# Patient Record
Sex: Male | Born: 1985 | Race: White | Hispanic: No | Marital: Single | Smoking: Current every day smoker
Health system: Southern US, Community
[De-identification: ages and names within clinical notes are randomized; demographics above are authoritative.]

## PROBLEM LIST (undated history)

## (undated) HISTORY — PX: ANKLE SURGERY: SHX546

---
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
## 2005-02-02 ENCOUNTER — Emergency Department (HOSPITAL_COMMUNITY): Admission: EM | Admit: 2005-02-02 | Discharge: 2005-02-02 | Payer: Self-pay | Admitting: Family Medicine

## 2007-09-28 ENCOUNTER — Emergency Department (HOSPITAL_COMMUNITY): Admission: EM | Admit: 2007-09-28 | Discharge: 2007-09-28 | Payer: Self-pay | Admitting: Emergency Medicine

## 2007-09-29 ENCOUNTER — Ambulatory Visit (HOSPITAL_COMMUNITY): Admission: RE | Admit: 2007-09-29 | Discharge: 2007-09-29 | Payer: Self-pay | Admitting: Emergency Medicine

## 2008-06-03 ENCOUNTER — Emergency Department (HOSPITAL_COMMUNITY): Admission: EM | Admit: 2008-06-03 | Discharge: 2008-06-03 | Payer: Self-pay | Admitting: Emergency Medicine

## 2010-09-28 ENCOUNTER — Emergency Department (HOSPITAL_COMMUNITY): Admission: EM | Admit: 2010-09-28 | Discharge: 2010-09-28 | Payer: Self-pay | Admitting: Emergency Medicine

## 2011-03-28 ENCOUNTER — Inpatient Hospital Stay (INDEPENDENT_AMBULATORY_CARE_PROVIDER_SITE_OTHER)
Admission: RE | Admit: 2011-03-28 | Discharge: 2011-03-28 | Disposition: A | Discharge status: Home / Self Care | Payer: BC Managed Care – PPO | Source: Ambulatory Visit | Attending: Emergency Medicine | Admitting: Emergency Medicine

## 2011-03-28 DIAGNOSIS — S61209A Unspecified open wound of unspecified finger without damage to nail, initial encounter: Secondary | ICD-10-CM

## 2011-05-18 NOTE — H&P (Signed)
[place]. [facility]  Patient:    Rodney Finley, LINCE Visit Number: [id number] MRN: [id number]          Service Type: Attending:  Sherri Rad, M.D. Dictated by:   Sherri Rad, M.D. Proc. Date: 09/15/01 Adm. Date:  09/15/01                           History and Physical  CHIEF COMPLAINT:  Right ankle pain.  HISTORY:  Tiran is a 25 year old young gentleman who was playing pickup street football when he twisted his right ankle.  He had a deformity.  His brother grabbed his foot and put it back into place where it was nice and straight.  He was then taken to [facility] Emergency Room where x-rays were obtained, and I was consulted for further evaluation.  There was no break in the skin at any time around the ankle.  He has no medical problems.  He has never had ankle problem in the past.  PHYSICAL EXAMINATION:  EXTREMITIES:  He has a swollen right ankle.  There is no deformity at this time.  He is neurovascularly intact.  Compartments are soft in leg and foot. Passive range of motion of the toes does not illicit any pain in the leg of foot.  LABORATORY DATA:  Three views of the right ankle were obtained an show a Tillaux fracture the anterolateral aspect of the ankle with what appears to be a supination external rotation type lateral malleolus fracture.  IMPRESSION:  Right Tillaux fracture with lateral malleolus fracture, displaced.  PLAN:  We will plan a CT scan to better delineate the pathology in the region of the plafond.  We will admit him for elevation, pain management, and observation.  We will most likely do him tomorrow.  He is to keep this elevated, and he is nonweightbearing to the lower extremity. Dictated by:   Sherri Rad, M.D. Attending:  Sherri Rad, M.D. DD:  09/15/01 TD:  09/16/01 Job: [id number]
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
## 2011-05-18 NOTE — Op Note (Signed)
[place]. [facility]  Patient:    REMY, VOILES Visit Number: [id number] MRN: [id number]          Service Type: MED Location: PEDS 6123 01 Attending Physician:  Sherri Rad Dictated by:   Sherri Rad, M.D. Proc. Date: 09/16/01 Admit Date:  09/15/2001                             Operative Report  PREOPERATIVE DIAGNOSES: 1. Right displaced Tillaux fracture. 2. Right non-displaced lateral malleus fracture.  POSTOPERATIVE DIAGNOSES: 1. Right displaced Tillaux fracture. 2. Right non-displaced lateral malleus fracture.  OPERATION: 1. Open reduction and internal fixation right Tillaux fracture. 2. Closed management without manipulation right lateral malleolus fracture.  SURGEON:  Sherri Rad, M.D.  ASSISTANTJill Side P. Mahar, P.A.  ANESTHESIA:  General endotracheal tube.  ESTIMATED BLOOD LOSS:  Minimal  TOURNIQUET TIME:  51 minutes  COMPLICATIONS:  None.  DISPOSITION:  Stable to recovery room.  INDICATION:  This is a 25 year old young gentleman who was playing football yesterday when he twisted his right ankle.  He had had deformity that his brother reduced in the field.  He was then taken to [facility] ER where x-rays were obtained and I was consulted for evaluation and treatment. X-rays and CT scan revealed that he had a displaced Tillaux fracture and non-displaced lateral malleolus fracture.  We were going to treat the Tillaux fracture operatively with open reduction and internal fixation and treat the lateral malleolus fracture with closed management.  The parents as well as Claus were explained the risks and benefits of the above. The risks which include infection, neural vessel injury, nonunion, malunion, arthritis, hardware irritation, possible growth plate disturbance and all questions were answered. Benefits were explained too, which include reduction of the fragment to try to decrease the rate of arthritis of the  ankle and anatomic reduction of the fracture fragment.  DESCRIPTION OF PROCEDURE:  The patient was brought to the operating room and placed in the supine position.  After adequate general endotracheal tube anesthesia was administered, as well as Ancef 1 gm piggyback. The right lower extremity was then prepped and draped in a sterile manner with a proximal placed thigh tourniquet.  Bump was placed on the epsilateral hip and internally rotated to the left of the right lower extremity.  The right lower extremity was then gravity exsanguinated and assisted with a 6 Ace wrap. The tourniquet was elevated 290 mmHg.  A longitudinal incision centered just lateral to the fracture site was then made.  Dissection was carried down to the subcutaneous.  The superficial peroneal nerve was isolated and protected throughout the case.  Retinaculum was opened in line with the incision. Extensor digitorum longus muscle belly was then retracted medially and neurovascular structures were protected medially as well.  The capsule was then opened in line with the incision.  Hematoma was evacuated.  The fracture site was identified and using a two point reduction clamp, the fracture was then visually reduced with a small stab incision posterior medially to place the prong. Once this was reduced, this was visualized under C arm guidance in the AP and lateral planes to be in adequate reduction.  Then a cannulated 4.0 partially threaded cancellus screw was then placed under guide wire guidance. This measured 40 mm long. Once this was placed and visualized under C arm guidance in the AP and lateral planes with the reduction  removed.  Final images were saved.  This was also verified visually to be in an excellent alignment and reduction.  Wounds in ankles were copiously irrigated with normal saline.  The capsule was closed with 2-0 Vicryl protecting nerve and vessels from entrapment.  Retinaculum was closed with 2-0  Vicryl again protecting nerves especially superficial peroneal.  The subcutaneous was closed.  The skin was closed with Nylon. A sterile dressing was applied. Jones dressing was applied. The patient was stable to recovery room. Dictated by:   Sherri Rad, M.D. Attending Physician:  Sherri Rad DD:  09/16/01 TD:  09/16/01 Job: [id number]

## 2012-08-20 ENCOUNTER — Encounter (HOSPITAL_COMMUNITY): Payer: Self-pay | Admitting: Emergency Medicine

## 2012-08-20 ENCOUNTER — Emergency Department (HOSPITAL_COMMUNITY)
Admission: EM | Admit: 2012-08-20 | Discharge: 2012-08-21 | Disposition: A | Discharge status: Home / Self Care | Payer: No Typology Code available for payment source | Attending: Emergency Medicine | Admitting: Emergency Medicine

## 2012-08-20 DIAGNOSIS — F17200 Nicotine dependence, unspecified, uncomplicated: Secondary | ICD-10-CM | POA: Insufficient documentation

## 2012-08-20 DIAGNOSIS — Y92410 Unspecified street and highway as the place of occurrence of the external cause: Secondary | ICD-10-CM | POA: Insufficient documentation

## 2012-08-20 DIAGNOSIS — S20219A Contusion of unspecified front wall of thorax, initial encounter: Secondary | ICD-10-CM
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
## 2012-08-21 ENCOUNTER — Emergency Department (HOSPITAL_COMMUNITY): Payer: No Typology Code available for payment source

## 2012-08-21 MED ORDER — IBUPROFEN 200 MG PO TABS
600.0000 mg | ORAL_TABLET | Freq: Once | ORAL | Status: AC
  Administered 2012-08-21: 600 mg via ORAL
  Filled 2012-08-21: qty 1
  Filled 2012-08-21: qty 3

## 2012-08-21 MED ORDER — IBUPROFEN 600 MG PO TABS: 600.0000 mg | ORAL_TABLET | Freq: Four times a day (QID) | ORAL | Status: AC | PRN

## 2012-08-21 MED ORDER — METHOCARBAMOL 500 MG PO TABS: 1000.0000 mg | ORAL_TABLET | Freq: Four times a day (QID) | ORAL | Status: AC

## 2012-08-21 MED ORDER — HYDROCODONE-ACETAMINOPHEN 5-325 MG PO TABS: ORAL_TABLET | ORAL | Status: AC
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
## 2012-08-22 NOTE — ED Provider Notes (Signed)
Medical screening examination/treatment/procedure(s) were performed by non-physician practitioner and as supervising physician I was immediately available for consultation/collaboration.  Hersh Minney, MD 08/22/12 0841 
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
## 2018-03-13 ENCOUNTER — Encounter (HOSPITAL_COMMUNITY): Payer: Self-pay | Admitting: Emergency Medicine

## 2018-03-13 ENCOUNTER — Emergency Department (HOSPITAL_COMMUNITY)
Admission: EM | Admit: 2018-03-13 | Discharge: 2018-03-13 | Disposition: A | Discharge status: Home / Self Care | Payer: Self-pay | Attending: Emergency Medicine | Admitting: Emergency Medicine

## 2018-03-13 ENCOUNTER — Other Ambulatory Visit: Payer: Self-pay

## 2018-03-13 ENCOUNTER — Emergency Department (HOSPITAL_COMMUNITY): Payer: Self-pay

## 2018-03-13 DIAGNOSIS — Y929 Unspecified place or not applicable: Secondary | ICD-10-CM | POA: Insufficient documentation

## 2018-03-13 DIAGNOSIS — Y939 Activity, unspecified: Secondary | ICD-10-CM | POA: Insufficient documentation

## 2018-03-13 DIAGNOSIS — W540XXA Bitten by dog, initial encounter: Secondary | ICD-10-CM | POA: Insufficient documentation

## 2018-03-13 DIAGNOSIS — S61431A Puncture wound without foreign body of right hand, initial encounter: Secondary | ICD-10-CM | POA: Insufficient documentation

## 2018-03-13 DIAGNOSIS — F17210 Nicotine dependence, cigarettes, uncomplicated: Secondary | ICD-10-CM | POA: Insufficient documentation

## 2018-03-13 DIAGNOSIS — Y999 Unspecified external cause status: Secondary | ICD-10-CM | POA: Insufficient documentation

## 2018-03-13 LAB — CBC WITH DIFFERENTIAL/PLATELET
Basophils Absolute: 0 10*3/uL (ref 0.0–0.1)
Basophils Relative: 0 %
EOS ABS: 0.1 10*3/uL (ref 0.0–0.7)
EOS PCT: 1 %
HCT: 44.5 % (ref 39.0–52.0)
Hemoglobin: 14.9 g/dL (ref 13.0–17.0)
LYMPHS ABS: 3.3 10*3/uL (ref 0.7–4.0)
LYMPHS PCT: 27 %
MCH: 30.2 pg (ref 26.0–34.0)
MCHC: 33.5 g/dL (ref 30.0–36.0)
MCV: 90.1 fL (ref 78.0–100.0)
MONO ABS: 1.1 10*3/uL — AB (ref 0.1–1.0)
Monocytes Relative: 9 %
Neutro Abs: 8 10*3/uL — ABNORMAL HIGH (ref 1.7–7.7)
Neutrophils Relative %: 63 %
PLATELETS: 204 10*3/uL (ref 150–400)
RBC: 4.94 MIL/uL (ref 4.22–5.81)
RDW: 12.7 % (ref 11.5–15.5)
WBC: 12.6 10*3/uL — ABNORMAL HIGH (ref 4.0–10.5)

## 2018-03-13 LAB — BASIC METABOLIC PANEL
Anion gap: 11 (ref 5–15)
BUN: 11 mg/dL (ref 6–20)
CALCIUM: 9.3 mg/dL (ref 8.9–10.3)
CO2: 22 mmol/L (ref 22–32)
CREATININE: 0.87 mg/dL (ref 0.61–1.24)
Chloride: 104 mmol/L (ref 101–111)
GFR calc Af Amer: >60 mL/min (ref >60)
GLUCOSE: 87 mg/dL (ref 65–99)
Potassium: 3.8 mmol/L (ref 3.5–5.1)
Sodium: 137 mmol/L (ref 135–145)

## 2018-03-13 MED ORDER — POVIDONE-IODINE 10 % EX SOLN
CUTANEOUS | Status: AC
  Administered 2018-03-13: 1
  Filled 2018-03-13: qty 15

## 2018-03-13 MED ORDER — AMOXICILLIN-POT CLAVULANATE 875-125 MG PO TABS: 1.0000 | ORAL_TABLET | Freq: Two times a day (BID) | ORAL | 0 refills | Status: AC

## 2018-03-13 MED ORDER — IBUPROFEN 800 MG PO TABS: 800.0000 mg | ORAL_TABLET | Freq: Three times a day (TID) | ORAL | 0 refills | Status: AC

## 2018-03-13 MED ORDER — KETOROLAC TROMETHAMINE 30 MG/ML IJ SOLN
30.0000 mg | Freq: Once | INTRAMUSCULAR | Status: AC
  Administered 2018-03-13: 30 mg via INTRAVENOUS
  Filled 2018-03-13: qty 1

## 2018-03-13 MED ORDER — SODIUM CHLORIDE 0.9 % IV SOLN
3.0000 g | Freq: Once | INTRAVENOUS | Status: AC
  Administered 2018-03-13: 3 g via INTRAVENOUS
  Filled 2018-03-13: qty 3

## 2018-03-13 NOTE — Discharge Instructions (Addendum)
You may continue to soak your hand in warm water twice a day.  Try to keep your hand elevated and avoid excessive use.  Keep the wounds bandaged.  Return here tomorrow for recheck.

## 2018-03-13 NOTE — ED Notes (Signed)
Animal control at bedside at this time.

## 2018-03-13 NOTE — ED Provider Notes (Signed)
[facility] EMERGENCY DEPARTMENT Provider Note   CSN: [id number] Arrival date & time: 03/13/18  0831     History   Chief Complaint Chief Complaint  Patient presents with  . Animal Bite    HPI Rodney Finley is a 32 y.o. male.  HPI   Rodney Finley is a 32 y.o. male who presents to the Emergency Department complaining of dog bite to the right hand.  He states that he was playing with his dog last evening when the dog accidentally bit his right hand.  Complains of 2 puncture wounds to the hand.  He is clean the wounds with peroxide and applied Neosporin.  He states that he woke around 3 AM this morning with chills, throbbing pain and swelling to his hand.  He has pain with movement of the right third fourth and fifth fingers.  States he is unable to fully extend the fourth finger due to pain.  He states the dog is up-to-date on his vaccinations.  He denies fever, red streaking, drainage, numbness of his hand or fingers.  He states his last tetanus was 3 years ago.   History reviewed. No pertinent past medical history.  There are no active problems to display for this patient.   Past Surgical History:  Procedure Laterality Date  . ANKLE SURGERY       Home Medications    Prior to Admission medications   Not on File    Family History History reviewed. No pertinent family history.  Social History Social History   Tobacco Use  . Smoking status: Current Every Day Smoker    Packs/day: 1.00    Types: Cigarettes  . Smokeless tobacco: Never Used  Substance Use Topics  . Alcohol use: No  . Drug use: Yes    Types: Marijuana     Allergies   Patient has no known allergies.   Review of Systems Review of Systems  Constitutional: Positive for chills and diaphoresis. Negative for fever.  Gastrointestinal: Negative for nausea and vomiting.  Musculoskeletal: Positive for arthralgias and joint swelling. Negative for back pain.  Skin: Positive for wound.       Puncture  wounds right hand  Neurological: Negative for dizziness, weakness and numbness.  Hematological: Does not bruise/bleed easily.  All other systems reviewed and are negative.    Physical Exam Updated Vital Signs BP 127/89 (BP Location: Right Arm)   Pulse 98   Temp 99 F (37.2 C) (Oral)   Resp 18   Ht 5\' 7"  (1.702 m)   Wt 68 kg (150 lb)   SpO2 100%   BMI 23.49 kg/m   Physical Exam  Constitutional: He is oriented to person, place, and time. He appears well-developed and well-nourished. No distress.  HENT:  Head: Normocephalic and atraumatic.  Cardiovascular: Normal rate, regular rhythm, normal heart sounds and intact distal pulses.  Pulmonary/Chest: Effort normal and breath sounds normal. No respiratory distress.  Musculoskeletal: He exhibits edema and tenderness.       Right hand: He exhibits decreased range of motion, tenderness and swelling. He exhibits normal capillary refill. Normal sensation noted. Decreased strength noted. He exhibits finger abduction. He exhibits no wrist extension trouble.       Hands: Puncture wounds to the right hand with moderate edema of the distal hand.  Pain with movement of the third, fourth and fifth fingers.  No lymphangitis.  Wrist non-tender  Neurological: He is alert and oriented to person, place, and time. No sensory deficit.  He exhibits normal muscle tone. Coordination normal.  Skin: Skin is warm. Capillary refill takes less than 2 seconds.  Nursing note and vitals reviewed.   Pt gave verbal consent for photos to be placed in his chart.  He verbalized understanding that photos are for medical use only and not stored on any digital devices.           ED Treatments / Results  Labs (all labs ordered are listed, but only abnormal results are displayed) Labs Reviewed  CBC WITH DIFFERENTIAL/PLATELET - Abnormal; Notable for the following components:      Result Value   WBC 12.6 (*)    Neutro Abs 8.0 (*)    Monocytes Absolute 1.1 (*)     All other components within normal limits  BASIC METABOLIC PANEL    EKG  EKG Interpretation None       Radiology Dg Hand Complete Right  Result Date: 03/13/2018 CLINICAL DATA:  Dog bite to the right hand EXAM: RIGHT HAND - COMPLETE 3+ VIEW COMPARISON:  None. FINDINGS: The radiocarpal joint space appears normal. There is a lucency within the ulnar aspect of the distal right radius which appears well corticated may be due to prior trauma or be congenital. The carpal bones are in normal position. MCP, PIP, and DIP joints appear normal. No fracture seen and no erosion is noted. IMPRESSION: Negative. Electronically Signed   By: Dwyane DeePaul  Barry M.D.   On: 03/13/2018 09:44   Procedures Procedures (including critical care time)  Medications Ordered in ED Medications  Ampicillin-Sulbactam (UNASYN) 3 g in sodium chloride 0.9 % 100 mL IVPB (not administered)  ketorolac (TORADOL) 30 MG/ML injection 30 mg (not administered)     Initial Impression / Assessment and Plan / ED Course  I have reviewed the triage vital signs and the nursing notes.  Pertinent labs & imaging results that were available during my care of the patient were reviewed by me and considered in my medical decision making (see chart for details).     Td is up to date per patient.  NV intact.  Wounds cleaned and bandaged, animal control contacted and dog's vaccinations are current.  Prescription for Augmentin, patient agrees to wound care instructions and follow-up in 2 days for recheck.  Final Clinical Impressions(s) / ED Diagnoses   Final diagnoses:  Dog bite, initial encounter    ED Discharge Orders    None       Rosey Bathriplett, Zhavia Cunanan, PA-C 03/15/18 1956    Bethann BerkshireZammit, Joseph, MD 03/15/18 2046

## 2018-03-13 NOTE — ED Triage Notes (Signed)
Pt was playing with his dog and dog went to grab toy and bite pt's hand. Laceration to right knuckle and lateral side of hand.   Pt states that dog is up to date on all vaccinations.

## 2018-10-12 IMAGING — DX DG HAND COMPLETE 3+V*R*
3 series · 3 of 3 positions shown · non-contrast
Comparison: None.

CLINICAL DATA: Dog bite to the right hand

EXAM:
RIGHT HAND - COMPLETE 3+ VIEW

[hand pa]
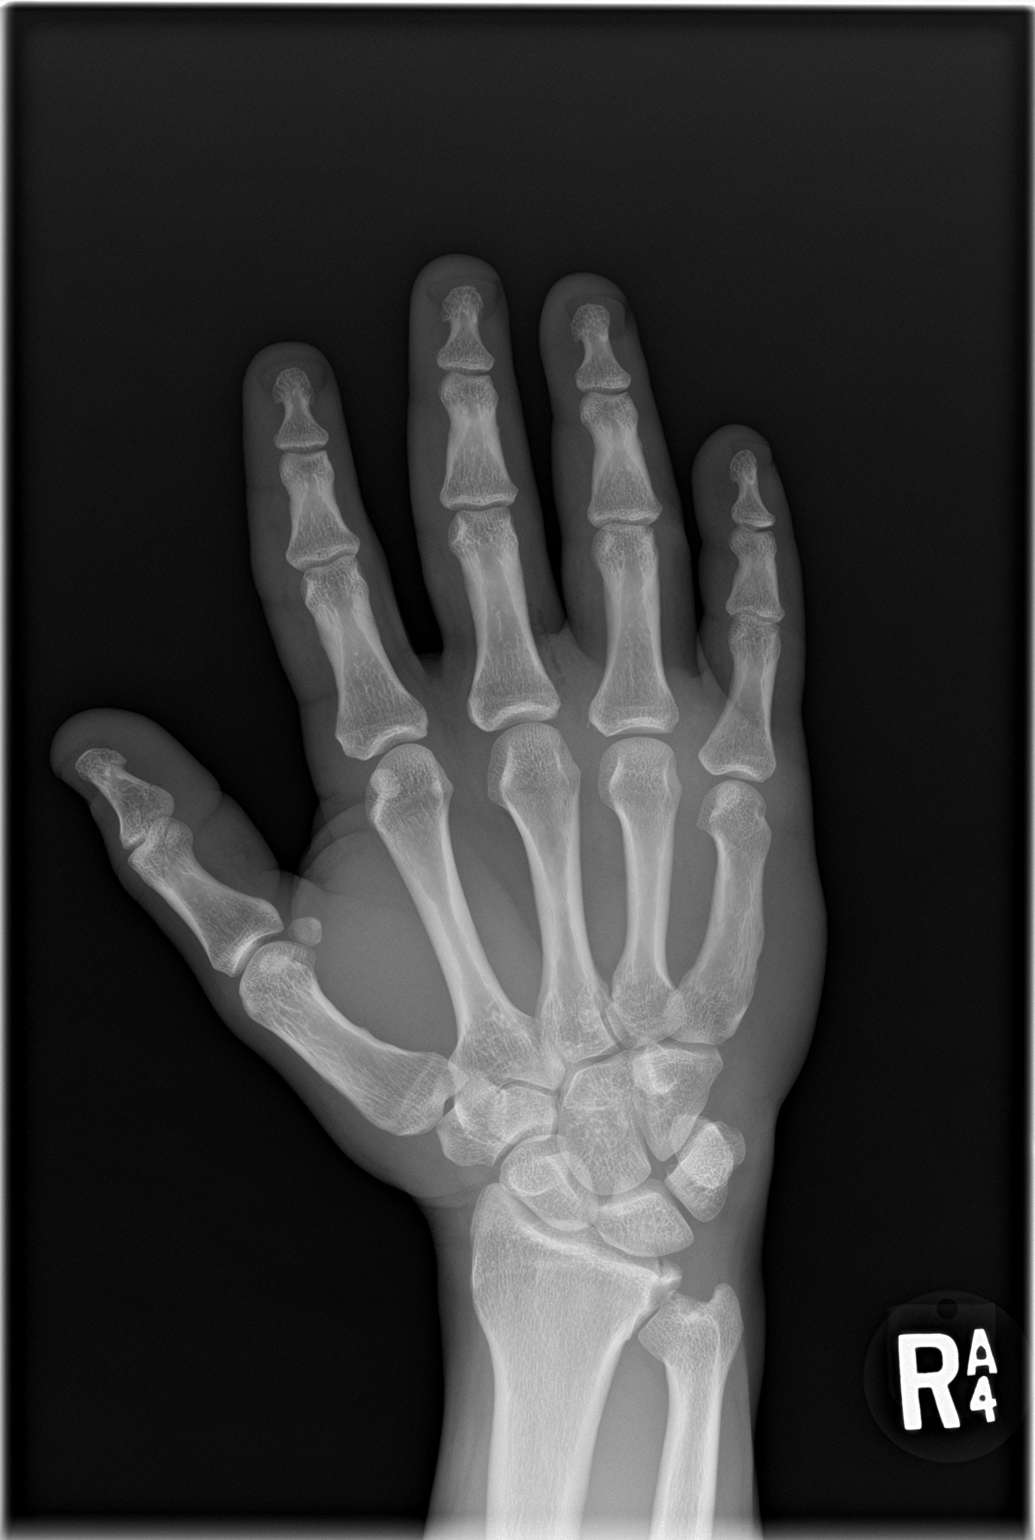

[hand obl]
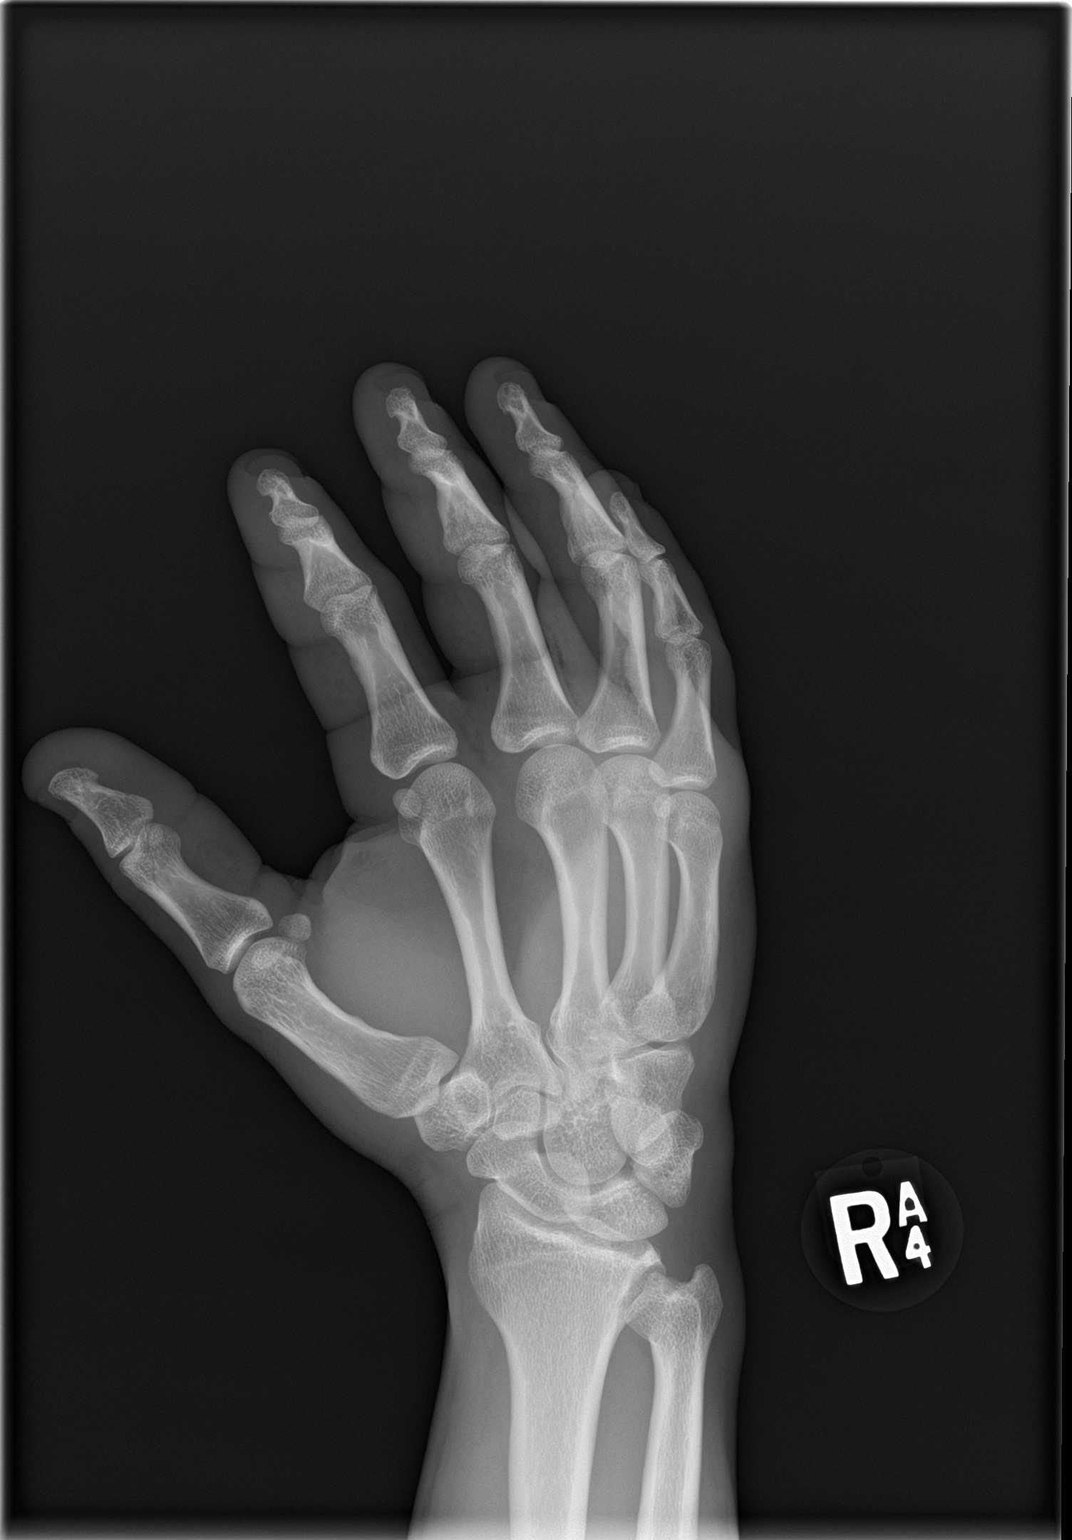

[hand lat]
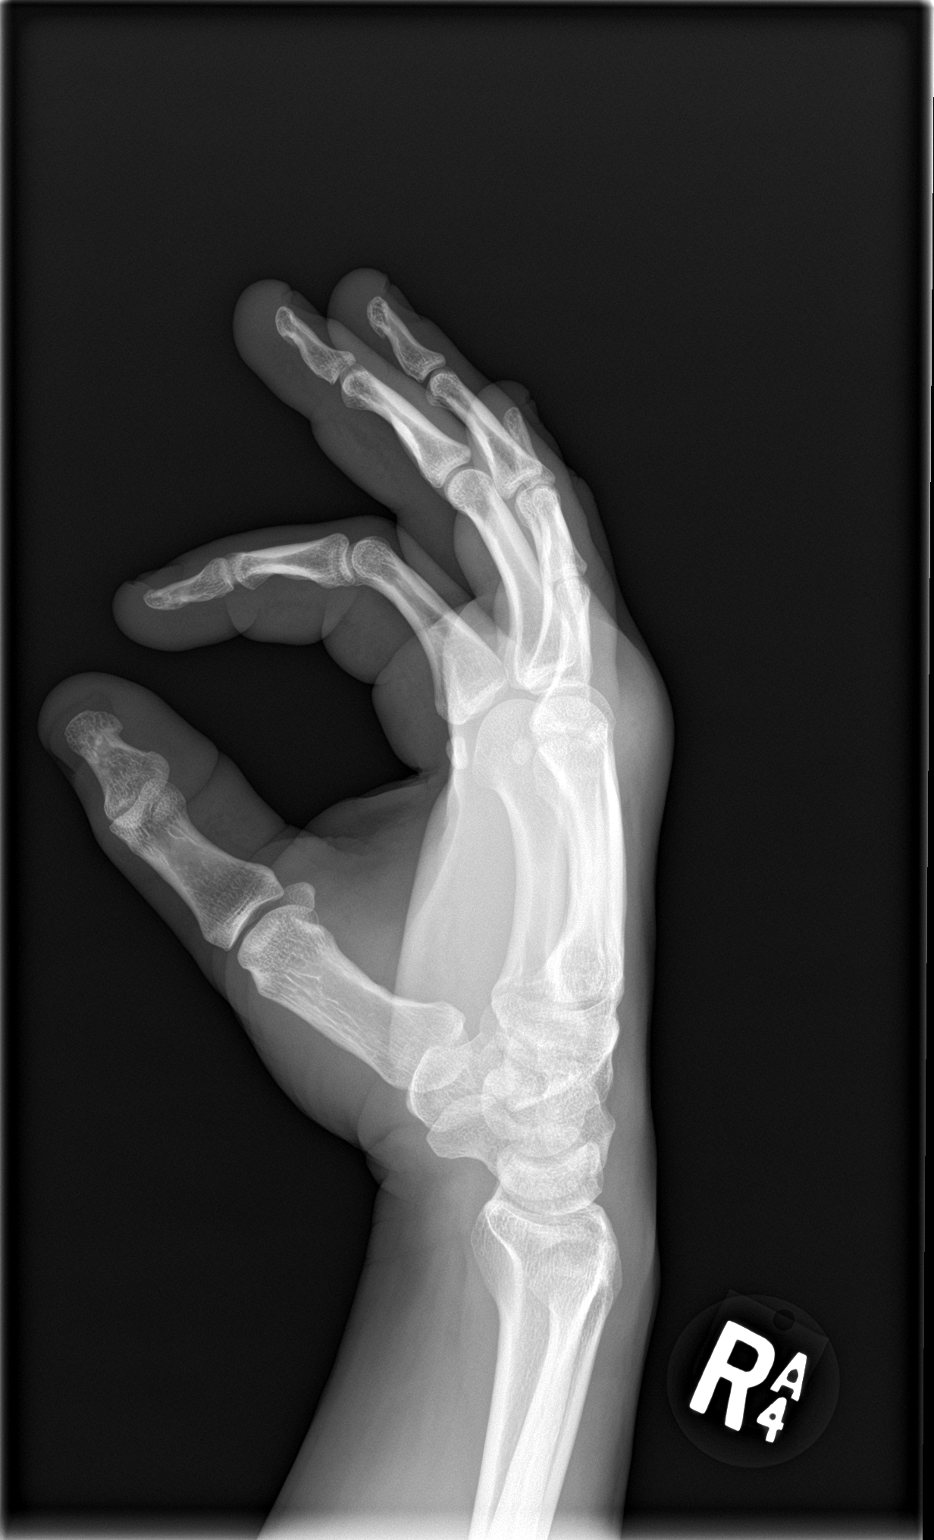

[3 of 3 positions shown; findings below may reference images not displayed]

FINDINGS: The radiocarpal joint space appears normal. There is a lucency
within the ulnar aspect of the distal right radius which appears
well corticated may be due to prior trauma or be congenital. The
carpal bones are in normal position. MCP, PIP, and DIP joints appear
normal. No fracture seen and no erosion is noted.
IMPRESSION: Negative.
